# Patient Record
Sex: Female | Born: 1955 | Race: White | Hispanic: No | Marital: Single | Smoking: Never smoker
Health system: Southern US, Community
[De-identification: ages and names within clinical notes are randomized; demographics above are authoritative.]

## PROBLEM LIST (undated history)

## (undated) DIAGNOSIS — G43909 Migraine, unspecified, not intractable, without status migrainosus: Secondary | ICD-10-CM

## (undated) DIAGNOSIS — F329 Major depressive disorder, single episode, unspecified: Secondary | ICD-10-CM

## (undated) DIAGNOSIS — I10 Essential (primary) hypertension: Secondary | ICD-10-CM

## (undated) DIAGNOSIS — F32A Depression, unspecified: Secondary | ICD-10-CM

## (undated) DIAGNOSIS — E7800 Pure hypercholesterolemia, unspecified: Secondary | ICD-10-CM

## (undated) HISTORY — DX: Migraine, unspecified, not intractable, without status migrainosus: G43.909

## (undated) HISTORY — PX: APPENDECTOMY: SHX54

## (undated) HISTORY — PX: HERNIA REPAIR: SHX51

## (undated) HISTORY — DX: Depression, unspecified: F32.A

## (undated) HISTORY — DX: Essential (primary) hypertension: I10

## (undated) HISTORY — PX: ABDOMINAL HYSTERECTOMY: SHX81

---
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
## 2016-12-07 ENCOUNTER — Emergency Department (INDEPENDENT_AMBULATORY_CARE_PROVIDER_SITE_OTHER)
Admission: EM | Admit: 2016-12-07 | Discharge: 2016-12-07 | Disposition: A | Discharge status: Home / Self Care | Payer: BLUE CROSS/BLUE SHIELD | Source: Home / Self Care | Attending: Family Medicine | Admitting: Family Medicine

## 2016-12-07 ENCOUNTER — Emergency Department (INDEPENDENT_AMBULATORY_CARE_PROVIDER_SITE_OTHER): Payer: BLUE CROSS/BLUE SHIELD

## 2016-12-07 DIAGNOSIS — B309 Viral conjunctivitis, unspecified: Secondary | ICD-10-CM | POA: Diagnosis not present

## 2016-12-07 DIAGNOSIS — R51 Headache: Secondary | ICD-10-CM

## 2016-12-07 DIAGNOSIS — R0981 Nasal congestion: Secondary | ICD-10-CM

## 2016-12-07 HISTORY — DX: Pure hypercholesterolemia, unspecified: E78.00

## 2016-12-07 LAB — POCT CBC W AUTO DIFF (K'VILLE URGENT CARE)

## 2016-12-07 MED ORDER — SULFACETAMIDE SODIUM 10 % OP SOLN
1.0000 [drp] | OPHTHALMIC | 0 refills | Status: DC
Start: 2016-12-07 — End: 2019-10-07

## 2016-12-07 MED ORDER — PREDNISONE 20 MG PO TABS: ORAL_TABLET | ORAL | 0 refills | Status: DC

## 2016-12-07 MED ORDER — AZITHROMYCIN 250 MG PO TABS: ORAL_TABLET | ORAL | 0 refills | Status: DC

## 2016-12-07 NOTE — ED Triage Notes (Signed)
Pt woke up this am with left eye drainage, headache, left ear pain, and runny nose.

## 2016-12-07 NOTE — Discharge Instructions (Signed)
If increasing cold symptoms develop, try the following: Take plain guaifenesin (1200mg  extended release tabs such as Mucinex) twice daily, with plenty of water, for cough and congestion.  May add Pseudoephedrine (30mg , one or two every 4 to 6 hours) for sinus congestion.  Get adequate rest.   May use Afrin nasal spray (or generic oxymetazoline) each morning for about 5 days and then discontinue.  Also recommend using saline nasal spray several times daily and saline nasal irrigation (AYR is a common brand).    Try warm salt water gargles for sore throat.  Stop all antihistamines (Claritin, etc) for now, and other non-prescription cough/cold preparations. May take Delsym Cough Suppressant at bedtime for nighttime cough.  Begin Azithromycin if not improving about one week or if persistent fever develops   Follow-up with family doctor if not improving about10 days.

## 2016-12-07 NOTE — ED Provider Notes (Signed)
[facility]    CSN: [id number] Arrival date & time: 12/07/16  1549     History   Chief Complaint Chief Complaint  Patient presents with  . Otalgia  . Eye Drainage  . Headache    HPI Molly SentersKimberly Graham is a 61 y.o. female.   Patient awoke this morning with mild swelling in her left eyelids, mild swelling left face, increased lacrimation left eye, increased sinus congestion (worse on the left), and left ear pressure.  She has been fatigued and had chills.  She has had minimal sore throat today, but no cough.  She has a history of perennial rhinitis and takes Claritin daily.  No changes in vision.   The history is provided by the patient.    Past Medical History:  Diagnosis Date  . High cholesterol     There are no active problems to display for this patient.   Past Surgical History:  Procedure Laterality Date  . ABDOMINAL HYSTERECTOMY    . APPENDECTOMY    . HERNIA REPAIR      OB History    No data available       Home Medications    Prior to Admission medications   Medication Sig Start Date End Date Taking? Authorizing Provider  estradiol (VIVELLE-DOT) 0.025 MG/24HR Place 1 patch onto the skin 2 (two) times a week.   Yes [provider]  rizatriptan (MAXALT) 10 MG tablet Take 5 mg by mouth as needed for migraine. May repeat in 2 hours if needed   Yes [provider]  azithromycin (ZITHROMAX Z-PAK) 250 MG tablet Take 2 tabs today; then begin one tab once daily for 4 more days. (Rx void after 12/15/16) 12/07/16   Lattie HawBeese, Cire Clute A, MD  predniSONE (DELTASONE) 20 MG tablet Take one tab by mouth twice daily for 4 days, then one daily. Take with food. 12/07/16   Lattie HawBeese, Raha Tennison A, MD  sulfacetamide (BLEPH-10) 10 % ophthalmic solution Place 1-2 drops into the left eye every 4 (four) hours. (while awake) 12/07/16   Lattie HawBeese, Bryannah Boston A, MD    Family History History reviewed. No pertinent family history.  Social History Social History  Substance  Use Topics  . Smoking status: Never Smoker  . Smokeless tobacco: Never Used  . Alcohol use No     Allergies   Codeine   Review of Systems Review of Systems + sore throat No cough No pleuritic pain No wheezing + nasal congestion + post-nasal drainage + sinus pain/pressure + itchy/red left eye with drainage. ? Left earache No hemoptysis No SOB No fever, + chills No nausea No vomiting No abdominal pain No diarrhea No urinary symptoms No skin rash + fatigue No myalgias + headache Used OTC meds without relief   Physical Exam Triage Vital Signs ED Triage Vitals  Enc Vitals Group     BP 12/07/16 1622 (!) 175/98     Pulse Rate 12/07/16 1622 60     Resp --      Temp 12/07/16 1622 97.7 F (36.5 C)     Temp Source 12/07/16 1622 Oral     SpO2 12/07/16 1622 98 %     Weight 12/07/16 1622 144 lb (65.3 kg)     Height 12/07/16 1622 5\' 3"  (1.6 m)     Head Circumference --      Peak Flow --      Pain Score 12/07/16 1623 0     Pain Loc --      Pain  Edu? --      Excl. in GC? --    No data found.   Updated Vital Signs BP (!) 175/98 (BP Location: Left Arm)   Pulse 60   Temp 97.7 F (36.5 C) (Oral)   Ht 5\' 3"  (1.6 m)   Wt 144 lb (65.3 kg)   SpO2 98%   BMI 25.51 kg/m   Visual Acuity Right Eye Distance:   Left Eye Distance:   Bilateral Distance:    Right Eye Near:   Left Eye Near:    Bilateral Near:     Physical Exam  Constitutional: She appears well-nourished. No distress.  HENT:  Head:    Right Ear: Tympanic membrane, external ear and ear canal normal.  Left Ear: Tympanic membrane, external ear and ear canal normal.  Nose: Mucosal edema present. Left sinus exhibits maxillary sinus tenderness.  Mouth/Throat: Oropharynx is clear and moist.  There is increased mucosal edema on the left.  Mild left maxillary sinus tenderness, and mild swelling over left maxillary sinus.  Eyes: EOM are normal. Pupils are equal, round, and reactive to light. Left eye  exhibits discharge. Left conjunctiva is injected.    Left conjunctivae minimally injected.  Left upper eyelid slightly swollen but nontender.  Left lower lid slightly swollen and tender to palpation.  There is mild swelling and tenderness to palpation over the left maxillary sinus area.  Neck: Neck supple.  Lateral nodes prominent, and tender on left.  Cardiovascular: Normal rate.   Pulmonary/Chest: Effort normal.  Musculoskeletal: She exhibits no edema.  Neurological: She is alert.  Skin: Skin is warm and dry.  Nursing note and vitals reviewed.    UC Treatments / Results  Labs (all labs ordered are listed, but only abnormal results are displayed) Labs Reviewed  POCT CBC W AUTO DIFF ([facility]):  WBC 6.3; LY 30.2; MO 7.4; GR 62.4; Hgb 12.6; Platelets 249     EKG  EKG Interpretation None       Radiology Dg Sinuses Complete  Result Date: 12/07/2016 CLINICAL DATA:  Sinus congestion and headache EXAM: PARANASAL SINUSES - COMPLETE 3 + VIEW COMPARISON:  None. FINDINGS: By plain film, the paranasal sinuses appear pneumatized. No mucosal thickening or air-fluid level is seen. No bony abnormality is noted. IMPRESSION: No plain film evidence of sinusitis. Electronically Signed   By: Dwyane Dee M.D.   On: 12/07/2016 16:58    Procedures Procedures (including critical care time)  Medications Ordered in UC Medications - No data to display   Initial Impression / Assessment and Plan / UC Course  I have reviewed the triage vital signs and the nursing notes.  Pertinent labs & imaging results that were available during my care of the patient were reviewed by me and considered in my medical decision making (see chart for details).    Normal WBC and sinus films reassuring.  Suspect early viral URI. Begin prednisone burst/taper, and sulfacetamide ophthalmic suspension to left eye. If increasing cold symptoms develop, try the following: Take plain guaifenesin (1200mg  extended  release tabs such as Mucinex) twice daily, with plenty of water, for cough and congestion.  May add Pseudoephedrine (30mg , one or two every 4 to 6 hours) for sinus congestion.  Get adequate rest.   May use Afrin nasal spray (or generic oxymetazoline) each morning for about 5 days and then discontinue.  Also recommend using saline nasal spray several times daily and saline nasal irrigation (AYR is a common brand).    Try  warm salt water gargles for sore throat.  Stop all antihistamines (Claritin, etc) for now, and other non-prescription cough/cold preparations. May take Delsym Cough Suppressant at bedtime for nighttime cough.  Begin Azithromycin if not improving about one week or if persistent fever develops (Given a prescription to hold, with an expiration date)  Follow-up with family doctor if not improving about10 days.     Final Clinical Impressions(s) / UC Diagnoses   Final diagnoses:  Viral conjunctivitis of left eye    New Prescriptions New Prescriptions   AZITHROMYCIN (ZITHROMAX Z-PAK) 250 MG TABLET    Take 2 tabs today; then begin one tab once daily for 4 more days. (Rx void after 12/15/16)   PREDNISONE (DELTASONE) 20 MG TABLET    Take one tab by mouth twice daily for 4 days, then one daily. Take with food.   SULFACETAMIDE (BLEPH-10) 10 % OPHTHALMIC SOLUTION    Place 1-2 drops into the left eye every 4 (four) hours. (while awake)     Lattie Haw, MD 12/07/16 [phone]

## 2017-10-29 DIAGNOSIS — I10 Essential (primary) hypertension: Secondary | ICD-10-CM | POA: Diagnosis not present

## 2018-04-21 DIAGNOSIS — W228XXA Striking against or struck by other objects, initial encounter: Secondary | ICD-10-CM | POA: Diagnosis not present

## 2018-04-21 DIAGNOSIS — Y999 Unspecified external cause status: Secondary | ICD-10-CM | POA: Diagnosis not present

## 2018-04-21 DIAGNOSIS — Z23 Encounter for immunization: Secondary | ICD-10-CM | POA: Diagnosis not present

## 2018-04-21 DIAGNOSIS — S0101XA Laceration without foreign body of scalp, initial encounter: Secondary | ICD-10-CM | POA: Diagnosis not present

## 2018-05-27 DIAGNOSIS — N8110 Cystocele, unspecified: Secondary | ICD-10-CM | POA: Diagnosis not present

## 2018-05-27 DIAGNOSIS — R350 Frequency of micturition: Secondary | ICD-10-CM | POA: Diagnosis not present

## 2018-05-27 DIAGNOSIS — N816 Rectocele: Secondary | ICD-10-CM | POA: Diagnosis not present

## 2018-07-22 DIAGNOSIS — N951 Menopausal and female climacteric states: Secondary | ICD-10-CM | POA: Diagnosis not present

## 2018-07-22 DIAGNOSIS — Z01419 Encounter for gynecological examination (general) (routine) without abnormal findings: Secondary | ICD-10-CM | POA: Diagnosis not present

## 2018-07-22 DIAGNOSIS — I10 Essential (primary) hypertension: Secondary | ICD-10-CM | POA: Diagnosis not present

## 2018-07-22 DIAGNOSIS — E782 Mixed hyperlipidemia: Secondary | ICD-10-CM | POA: Diagnosis not present

## 2018-07-22 DIAGNOSIS — Z0000 Encounter for general adult medical examination without abnormal findings: Secondary | ICD-10-CM | POA: Diagnosis not present

## 2018-07-22 DIAGNOSIS — G43009 Migraine without aura, not intractable, without status migrainosus: Secondary | ICD-10-CM | POA: Diagnosis not present

## 2018-07-26 DIAGNOSIS — Z1239 Encounter for other screening for malignant neoplasm of breast: Secondary | ICD-10-CM | POA: Diagnosis not present

## 2018-07-26 DIAGNOSIS — Z1231 Encounter for screening mammogram for malignant neoplasm of breast: Secondary | ICD-10-CM | POA: Diagnosis not present

## 2019-05-21 DIAGNOSIS — Z90710 Acquired absence of both cervix and uterus: Secondary | ICD-10-CM | POA: Diagnosis not present

## 2019-05-21 DIAGNOSIS — N8111 Cystocele, midline: Secondary | ICD-10-CM | POA: Diagnosis not present

## 2019-07-21 DIAGNOSIS — Z9189 Other specified personal risk factors, not elsewhere classified: Secondary | ICD-10-CM | POA: Diagnosis not present

## 2019-07-21 DIAGNOSIS — I10 Essential (primary) hypertension: Secondary | ICD-10-CM | POA: Diagnosis not present

## 2019-07-21 DIAGNOSIS — R5383 Other fatigue: Secondary | ICD-10-CM | POA: Diagnosis not present

## 2019-07-21 DIAGNOSIS — J101 Influenza due to other identified influenza virus with other respiratory manifestations: Secondary | ICD-10-CM | POA: Diagnosis not present

## 2019-07-21 DIAGNOSIS — J069 Acute upper respiratory infection, unspecified: Secondary | ICD-10-CM | POA: Diagnosis not present

## 2019-07-21 DIAGNOSIS — Z20822 Contact with and (suspected) exposure to covid-19: Secondary | ICD-10-CM | POA: Diagnosis not present

## 2019-07-21 DIAGNOSIS — R030 Elevated blood-pressure reading, without diagnosis of hypertension: Secondary | ICD-10-CM | POA: Diagnosis not present

## 2019-07-28 DIAGNOSIS — I10 Essential (primary) hypertension: Secondary | ICD-10-CM | POA: Diagnosis not present

## 2019-07-28 DIAGNOSIS — Z01419 Encounter for gynecological examination (general) (routine) without abnormal findings: Secondary | ICD-10-CM | POA: Diagnosis not present

## 2019-07-28 DIAGNOSIS — N8111 Cystocele, midline: Secondary | ICD-10-CM | POA: Diagnosis not present

## 2019-07-28 DIAGNOSIS — Z1231 Encounter for screening mammogram for malignant neoplasm of breast: Secondary | ICD-10-CM | POA: Diagnosis not present

## 2019-07-28 DIAGNOSIS — N951 Menopausal and female climacteric states: Secondary | ICD-10-CM | POA: Diagnosis not present

## 2019-10-07 ENCOUNTER — Ambulatory Visit (INDEPENDENT_AMBULATORY_CARE_PROVIDER_SITE_OTHER): Payer: BC Managed Care – PPO

## 2019-10-07 ENCOUNTER — Other Ambulatory Visit: Payer: Self-pay

## 2019-10-07 ENCOUNTER — Ambulatory Visit (INDEPENDENT_AMBULATORY_CARE_PROVIDER_SITE_OTHER): Payer: BC Managed Care – PPO | Admitting: Sports Medicine

## 2019-10-07 ENCOUNTER — Encounter: Payer: Self-pay | Admitting: Sports Medicine

## 2019-10-07 DIAGNOSIS — M1811 Unilateral primary osteoarthritis of first carpometacarpal joint, right hand: Secondary | ICD-10-CM | POA: Insufficient documentation

## 2019-10-07 DIAGNOSIS — M79631 Pain in right forearm: Secondary | ICD-10-CM

## 2019-10-07 DIAGNOSIS — M19031 Primary osteoarthritis, right wrist: Secondary | ICD-10-CM | POA: Diagnosis not present

## 2019-10-07 DIAGNOSIS — M47812 Spondylosis without myelopathy or radiculopathy, cervical region: Secondary | ICD-10-CM | POA: Diagnosis not present

## 2019-10-07 MED ORDER — PREDNISONE 50 MG PO TABS: ORAL_TABLET | ORAL | 0 refills | Status: DC

## 2019-10-07 MED ORDER — MELOXICAM 15 MG PO TABS
ORAL_TABLET | ORAL | 3 refills | Status: DC
Start: 2019-10-07 — End: 2020-02-27

## 2019-10-07 NOTE — Assessment & Plan Note (Signed)
This is a pleasant 64 year old female, for several weeks she is had pain in her right forearm with radiation from the elbow down to the dorsum of the hand to the fifth finger. No trauma, no paresthesias, she does have history of carpal tunnel surgery decades ago. She denies any neck pain, no progressive weakness. Exam is for the most part completely unremarkable. Adding x-rays of the wrist, neck, 5 days of prednisone, meloxicam, return to see me in 1 month, we will target her treatment if no better, although I do not know what her primary pain generator is today.

## 2019-10-07 NOTE — Progress Notes (Signed)
    Procedures performed today:    None.  Independent interpretation of notes and tests performed by another provider:   None.  Brief History, Exam, Impression, and Recommendations:    Right forearm pain This is a pleasant 64 year old female, for several weeks she is had pain in her right forearm with radiation from the elbow down to the dorsum of the hand to the fifth finger. No trauma, no paresthesias, she does have history of carpal tunnel surgery decades ago. She denies any neck pain, no progressive weakness. Exam is for the most part completely unremarkable. Adding x-rays of the wrist, neck, 5 days of prednisone, meloxicam, return to see me in 1 month, we will target her treatment if no better, although I do not know what her primary pain generator is today.    ___________________________________________ Ihor Austin. Benjamin Stain, M.D., ABFM., CAQSM. Primary Care and Sports Medicine [facility]  Adjunct Instructor of Family Medicine  [employer]

## 2019-11-03 ENCOUNTER — Ambulatory Visit (INDEPENDENT_AMBULATORY_CARE_PROVIDER_SITE_OTHER): Payer: BC Managed Care – PPO | Admitting: Sports Medicine

## 2019-11-03 ENCOUNTER — Other Ambulatory Visit: Payer: Self-pay

## 2019-11-03 DIAGNOSIS — M1811 Unilateral primary osteoarthritis of first carpometacarpal joint, right hand: Secondary | ICD-10-CM

## 2019-11-03 NOTE — Progress Notes (Signed)
    Procedures performed today:    None.  Independent interpretation of notes and tests performed by another provider:   X-rays personally reviewed and show CMC osteoarthritis, mild multilevel cervical DDD.  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 Her exam was benign at that time. X-rays did show degenerative changes in her cervical spine as well as CMC arthritis on the right. She is doing significantly better with meloxicam, today her pain has declared itself as first CMC pain. Advised her if she continues to do well we can continue conservative treatment with NSAIDs but if pain recurs we would do a CMC injection, I am going to print some thumb basal joint rehab exercises and she can return to see me on an as-needed basis.    ___________________________________________ Ihor Austin. Benjamin Stain, M.D., ABFM., CAQSM. Primary Care and Sports Medicine [facility]  Adjunct Instructor of Family Medicine  [employer]

## 2019-11-03 NOTE — Assessment & Plan Note (Signed)
This is a pleasant 64 year old female, I saw her at the last visit, she had pain radiating from her shoulder down to her fingers, I had trouble localizing the pain at that visit, we x-rayed her neck, her hand. Her exam was benign at that time. X-rays did show degenerative changes in her cervical spine as well as CMC arthritis on the right. She is doing significantly better with meloxicam, today her pain has declared itself as first CMC pain. Advised her if she continues to do well we can continue conservative treatment with NSAIDs but if pain recurs we would do a CMC injection, I am going to print some thumb basal joint rehab exercises and she can return to see me on an as-needed basis.

## 2020-02-27 ENCOUNTER — Other Ambulatory Visit: Payer: Self-pay | Admitting: Sports Medicine

## 2020-02-27 DIAGNOSIS — M79631 Pain in right forearm: Secondary | ICD-10-CM

## 2021-04-26 IMAGING — DX DG CERVICAL SPINE COMPLETE 4+V
6 series · 6 of 6 positions shown · non-contrast
Comparison: None.

CLINICAL DATA: Extremity pain

EXAM:
CERVICAL SPINE - COMPLETE 4+ VIEW

[c-spine lat]
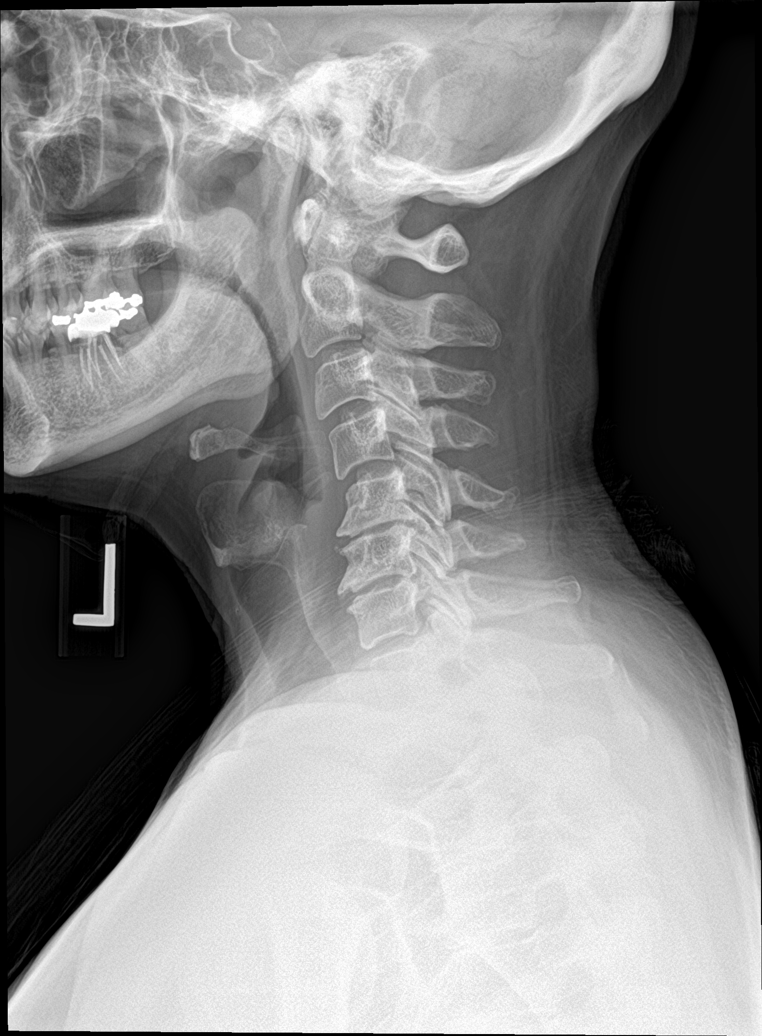

[c-spine obl (1 of 2)]
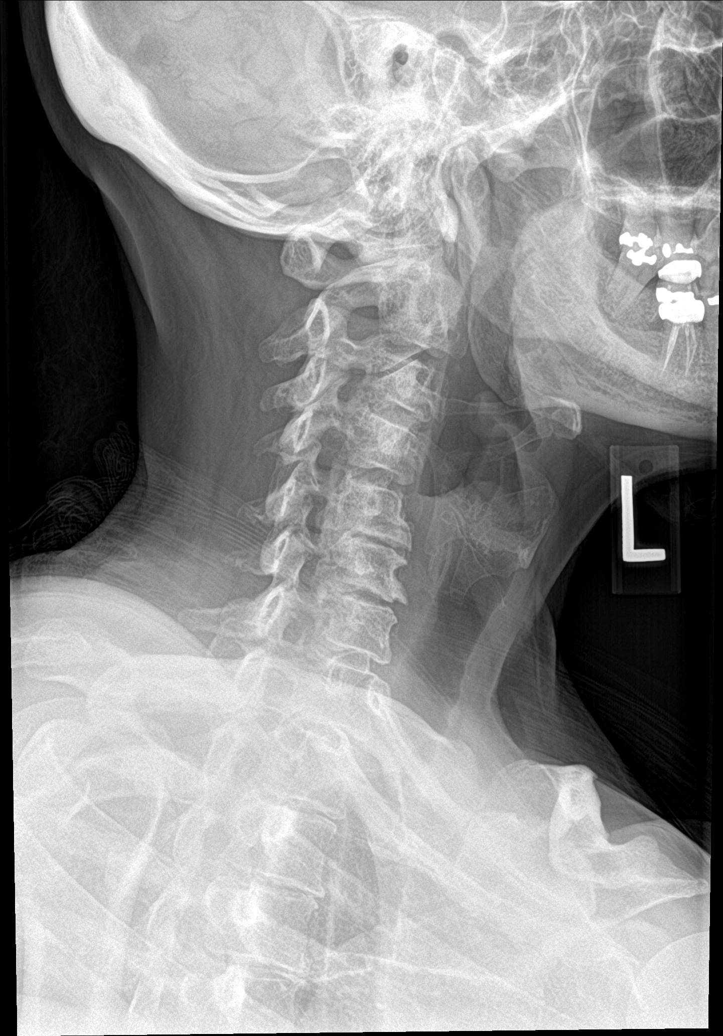

[c-spine obl (2 of 2)]
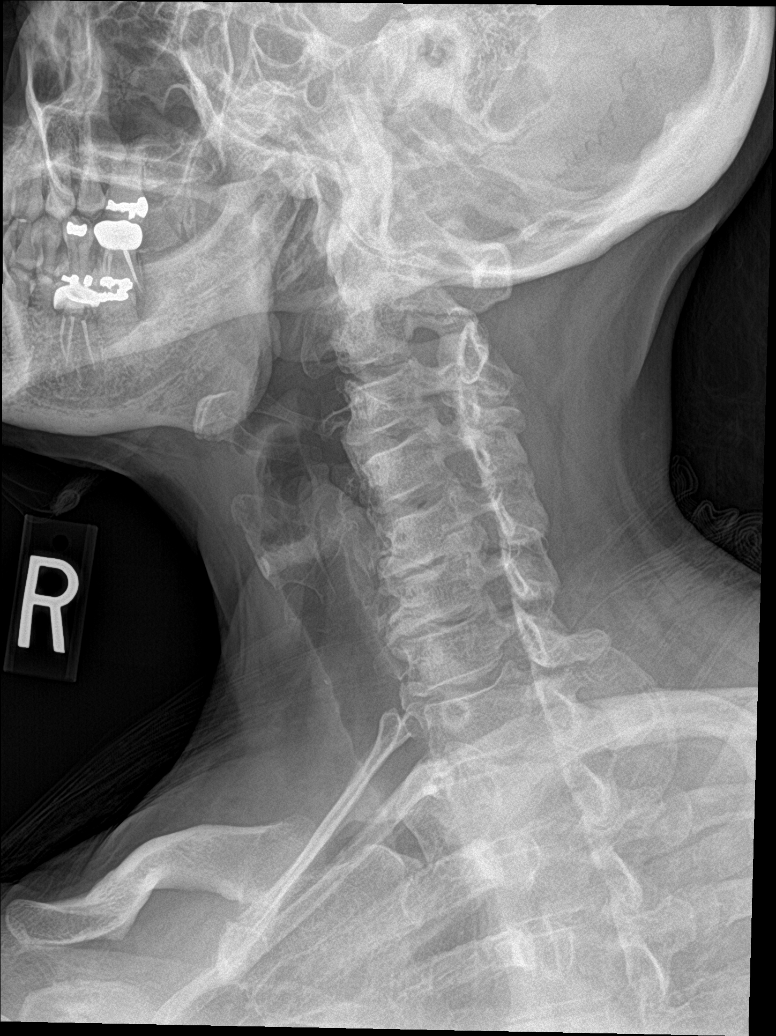

[c-spine ap]
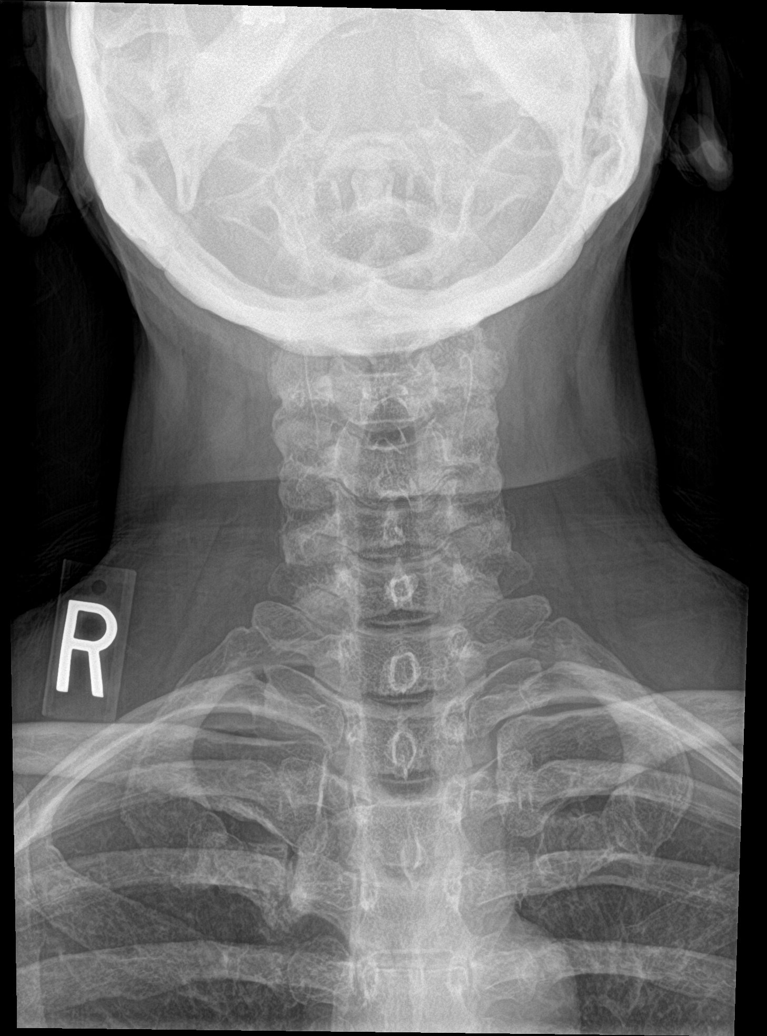

[c-spine open mouth]
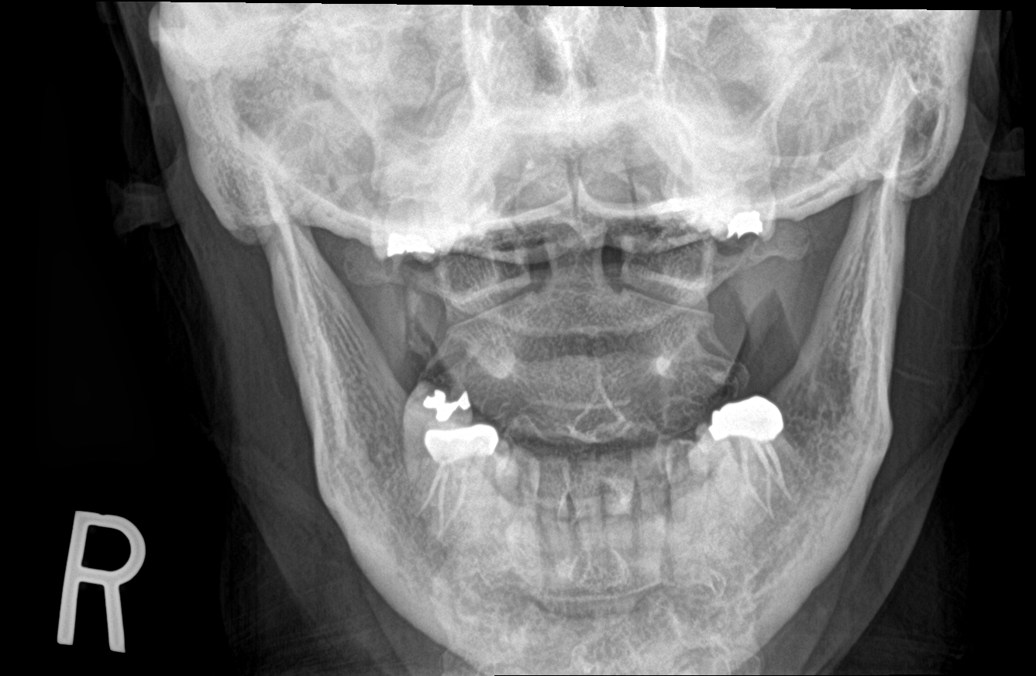

[c-spine swimmers]
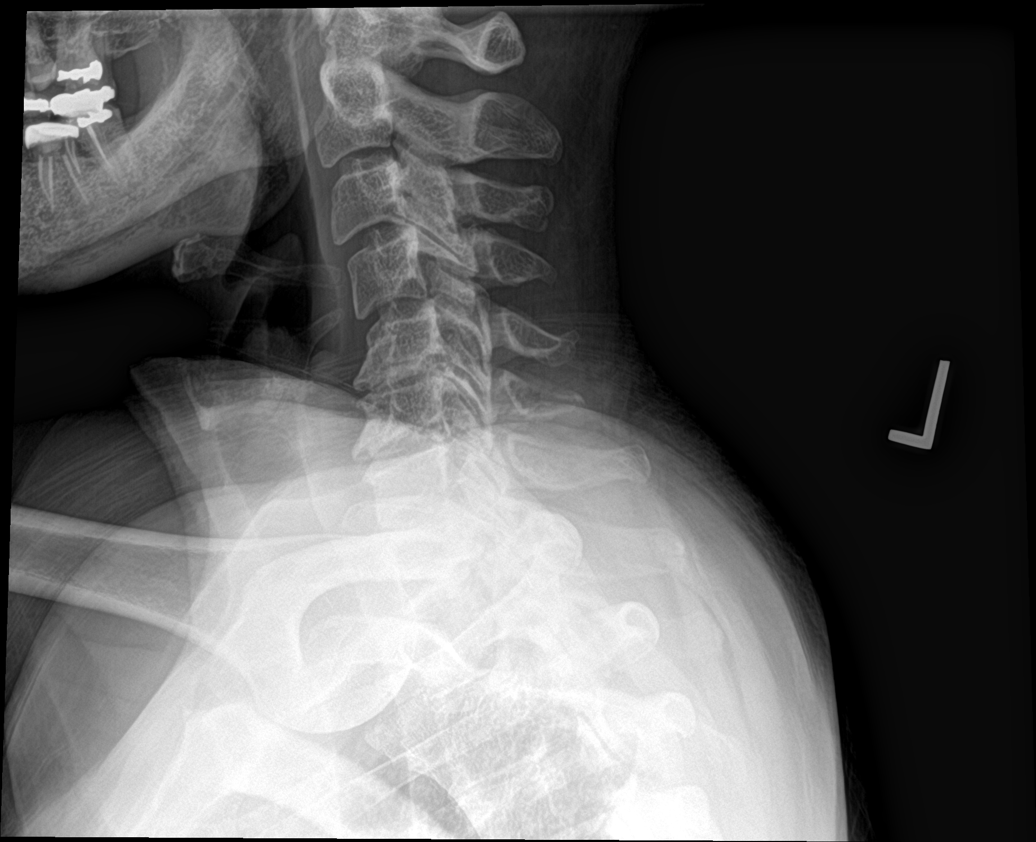

[6 of 6 positions shown; findings below may reference images not displayed]

FINDINGS: Mild reversal of cervical lordosis. Vertebral body heights are
maintained. Moderate to marked disc space narrowing at C5-C6 and
C6-C7. Bilateral right greater than left foraminal narrowing at
these levels. Mild bilateral foraminal narrowing at C3-C4 as well.
Dens and lateral masses are within normal limits.
IMPRESSION: Reversal of cervical lordosis with degenerative changes, most
evident at C5-C6 and C6-C7.
